# Patient Record
Sex: Male | Born: 1960 | Race: Black or African American | Hispanic: No | Smoking: Current some day smoker
Health system: Southern US, Community
[De-identification: ages and names within clinical notes are randomized; demographics above are authoritative.]

## PROBLEM LIST (undated history)

## (undated) DIAGNOSIS — I10 Essential (primary) hypertension: Secondary | ICD-10-CM

## (undated) DIAGNOSIS — E119 Type 2 diabetes mellitus without complications: Secondary | ICD-10-CM

## (undated) DIAGNOSIS — E785 Hyperlipidemia, unspecified: Secondary | ICD-10-CM

## (undated) DIAGNOSIS — M1990 Unspecified osteoarthritis, unspecified site: Secondary | ICD-10-CM

## (undated) DIAGNOSIS — M109 Gout, unspecified: Secondary | ICD-10-CM

## (undated) DIAGNOSIS — R569 Unspecified convulsions: Secondary | ICD-10-CM

## (undated) HISTORY — DX: Type 2 diabetes mellitus without complications: E11.9

## (undated) HISTORY — DX: Hyperlipidemia, unspecified: E78.5

## (undated) HISTORY — DX: Gout, unspecified: M10.9

## (undated) HISTORY — DX: Unspecified convulsions: R56.9

## (undated) HISTORY — DX: Essential (primary) hypertension: I10

## (undated) HISTORY — DX: Unspecified osteoarthritis, unspecified site: M19.90

---

## 2001-02-04 ENCOUNTER — Emergency Department (HOSPITAL_COMMUNITY): Admission: EM | Admit: 2001-02-04 | Discharge: 2001-02-04 | Payer: Self-pay | Admitting: Emergency Medicine

## 2001-12-08 ENCOUNTER — Encounter: Payer: Self-pay | Admitting: Emergency Medicine

## 2001-12-08 ENCOUNTER — Emergency Department (HOSPITAL_COMMUNITY): Admission: EM | Admit: 2001-12-08 | Discharge: 2001-12-08 | Payer: Self-pay | Admitting: Emergency Medicine

## 2009-05-17 ENCOUNTER — Emergency Department (HOSPITAL_COMMUNITY): Admission: EM | Admit: 2009-05-17 | Discharge: 2009-05-17 | Payer: Self-pay | Admitting: Family Medicine

## 2010-04-05 LAB — POCT I-STAT, CHEM 8
BUN: 12 mg/dL (ref 6–23)
Calcium, Ion: 1.1 mmol/L — ABNORMAL LOW (ref 1.12–1.32)
Chloride: 107 mEq/L (ref 96–112)
Creatinine, Ser: 1 mg/dL (ref 0.4–1.5)
Glucose, Bld: 101 mg/dL — ABNORMAL HIGH (ref 70–99)
HCT: 48 % (ref 39.0–52.0)
Hemoglobin: 16.3 g/dL (ref 13.0–17.0)
Potassium: 4.5 mEq/L (ref 3.5–5.1)
Sodium: 138 mEq/L (ref 135–145)
TCO2: 23 mmol/L (ref 0–100)

## 2010-04-05 LAB — CBC
HCT: 44.2 % (ref 39.0–52.0)
Hemoglobin: 15.6 g/dL (ref 13.0–17.0)
MCHC: 35.3 g/dL (ref 30.0–36.0)
MCV: 98.1 fL (ref 78.0–100.0)
Platelets: 143 10*3/uL — ABNORMAL LOW (ref 150–400)
RBC: 4.5 MIL/uL (ref 4.22–5.81)
RDW: 12.6 % (ref 11.5–15.5)
WBC: 4 10*3/uL (ref 4.0–10.5)

## 2010-04-05 LAB — POCT URINALYSIS DIP (DEVICE)
Bilirubin Urine: NEGATIVE
Glucose, UA: NEGATIVE mg/dL
Ketones, ur: NEGATIVE mg/dL
Nitrite: NEGATIVE
Protein, ur: NEGATIVE mg/dL
Specific Gravity, Urine: 1.02 (ref 1.005–1.030)
Urobilinogen, UA: 0.2 mg/dL (ref 0.0–1.0)
pH: 7 (ref 5.0–8.0)

## 2010-04-05 LAB — DIFFERENTIAL
Basophils Absolute: 0 10*3/uL (ref 0.0–0.1)
Basophils Relative: 0 % (ref 0–1)
Eosinophils Absolute: 0.1 10*3/uL (ref 0.0–0.7)
Eosinophils Relative: 2 % (ref 0–5)
Lymphocytes Relative: 35 % (ref 12–46)
Lymphs Abs: 1.4 10*3/uL (ref 0.7–4.0)
Monocytes Absolute: 0.3 10*3/uL (ref 0.1–1.0)
Monocytes Relative: 8 % (ref 3–12)
Neutro Abs: 2.2 10*3/uL (ref 1.7–7.7)
Neutrophils Relative %: 54 % (ref 43–77)

## 2010-05-27 ENCOUNTER — Emergency Department (HOSPITAL_COMMUNITY)
Admission: EM | Admit: 2010-05-27 | Discharge: 2010-05-27 | Disposition: A | Discharge status: Home / Self Care | Payer: Medicare Other | Attending: Emergency Medicine | Admitting: Emergency Medicine

## 2010-05-27 ENCOUNTER — Emergency Department (HOSPITAL_COMMUNITY): Payer: Medicare Other

## 2010-05-27 DIAGNOSIS — E7800 Pure hypercholesterolemia, unspecified: Secondary | ICD-10-CM | POA: Insufficient documentation

## 2010-05-27 DIAGNOSIS — G40909 Epilepsy, unspecified, not intractable, without status epilepticus: Secondary | ICD-10-CM | POA: Insufficient documentation

## 2010-05-27 DIAGNOSIS — M79609 Pain in unspecified limb: Secondary | ICD-10-CM | POA: Insufficient documentation

## 2010-05-27 DIAGNOSIS — R209 Unspecified disturbances of skin sensation: Secondary | ICD-10-CM | POA: Insufficient documentation

## 2010-05-27 DIAGNOSIS — M7989 Other specified soft tissue disorders: Secondary | ICD-10-CM | POA: Insufficient documentation

## 2010-05-27 DIAGNOSIS — Z79899 Other long term (current) drug therapy: Secondary | ICD-10-CM | POA: Insufficient documentation

## 2011-01-03 ENCOUNTER — Emergency Department: Payer: Self-pay | Admitting: Emergency Medicine

## 2012-06-01 IMAGING — CR DG FOOT COMPLETE 3+V*L*
3 series · 3 of 3 positions shown · non-contrast
Comparison: None.

CLINICAL DATA: Left-sided foot pain.

LEFT FOOT - COMPLETE 3+ VIEW

[t foot ap left]
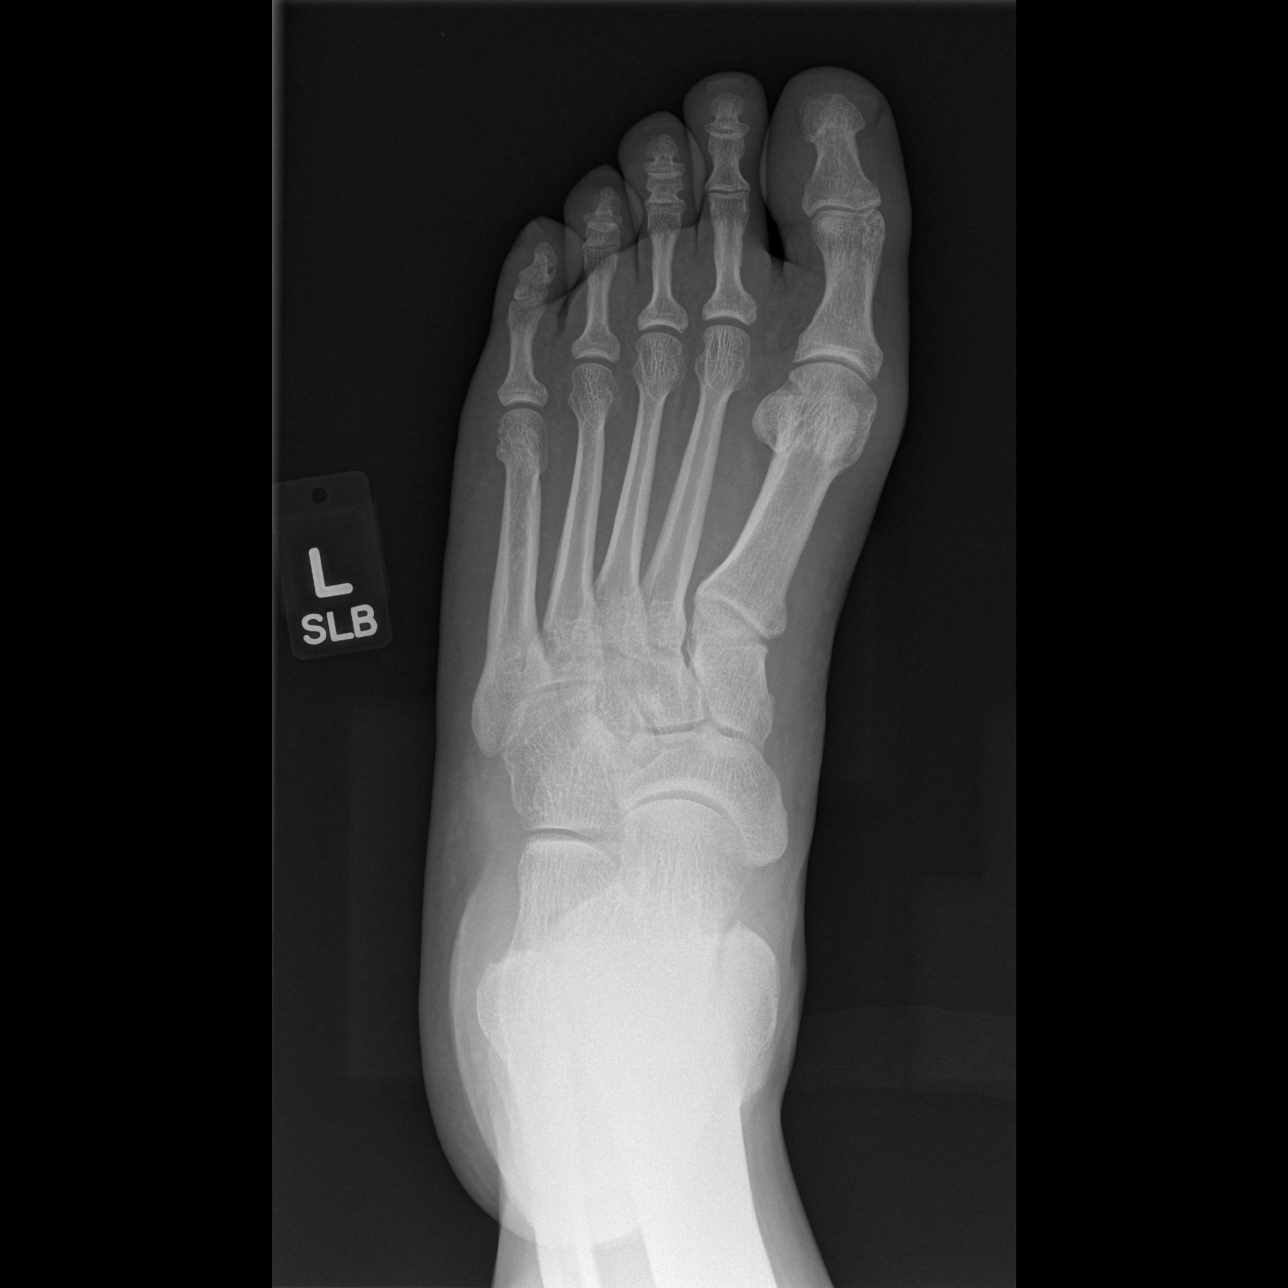

[t foot oblique left]
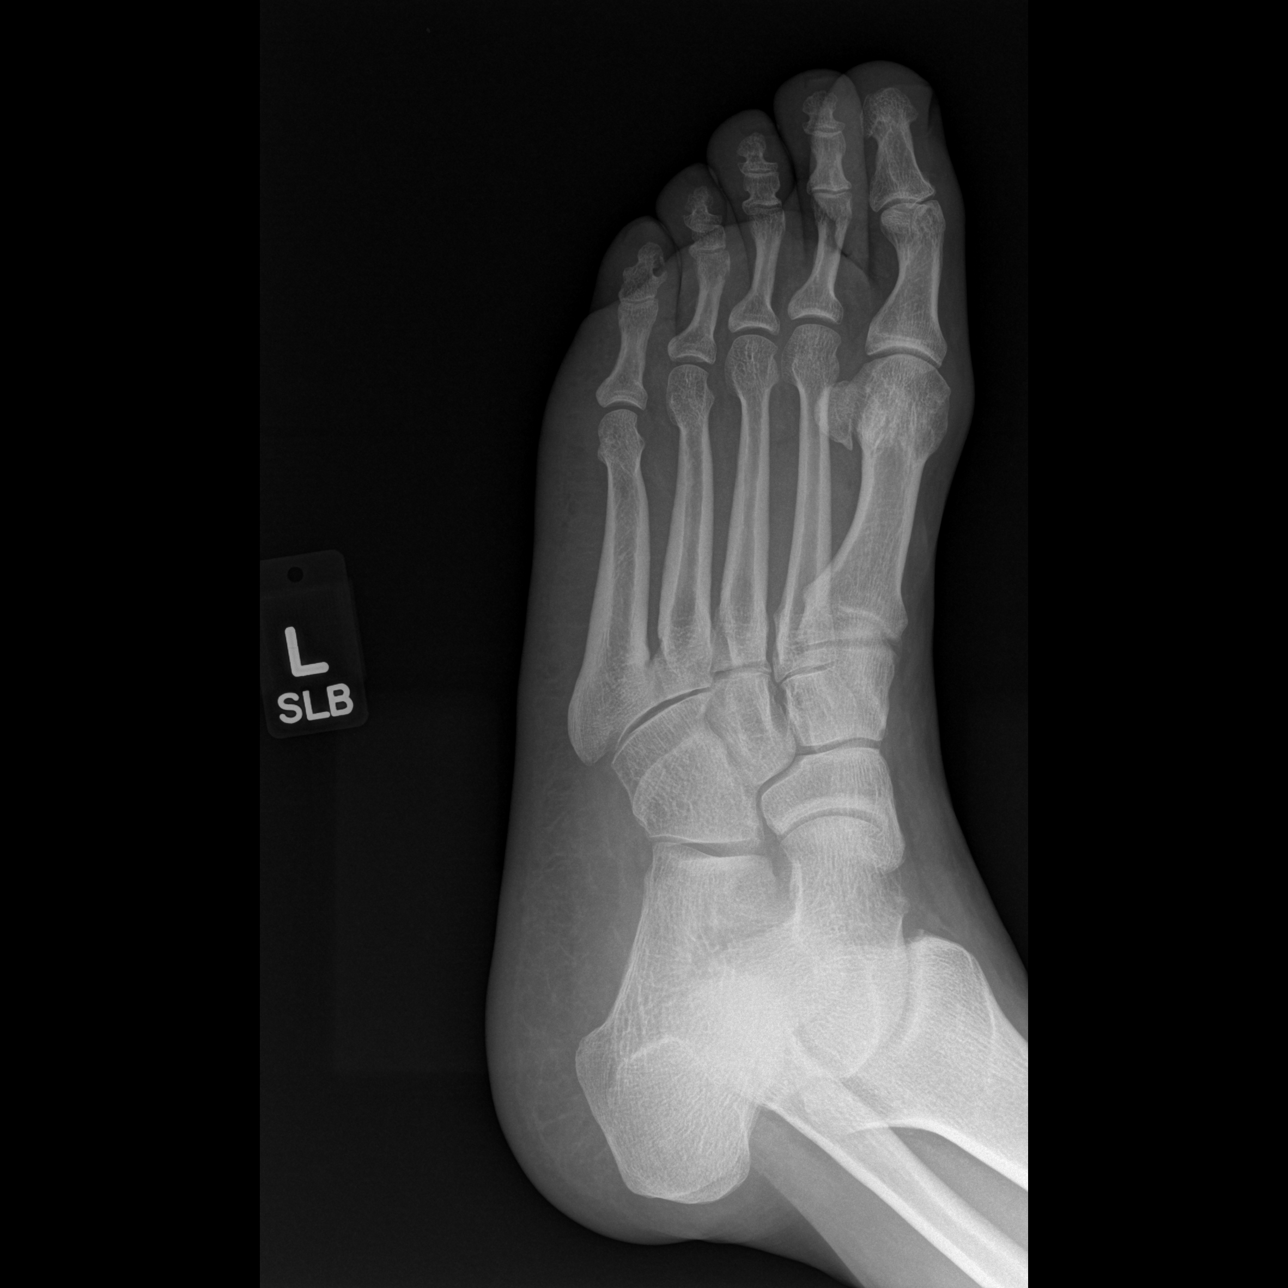

[t foot lat left]
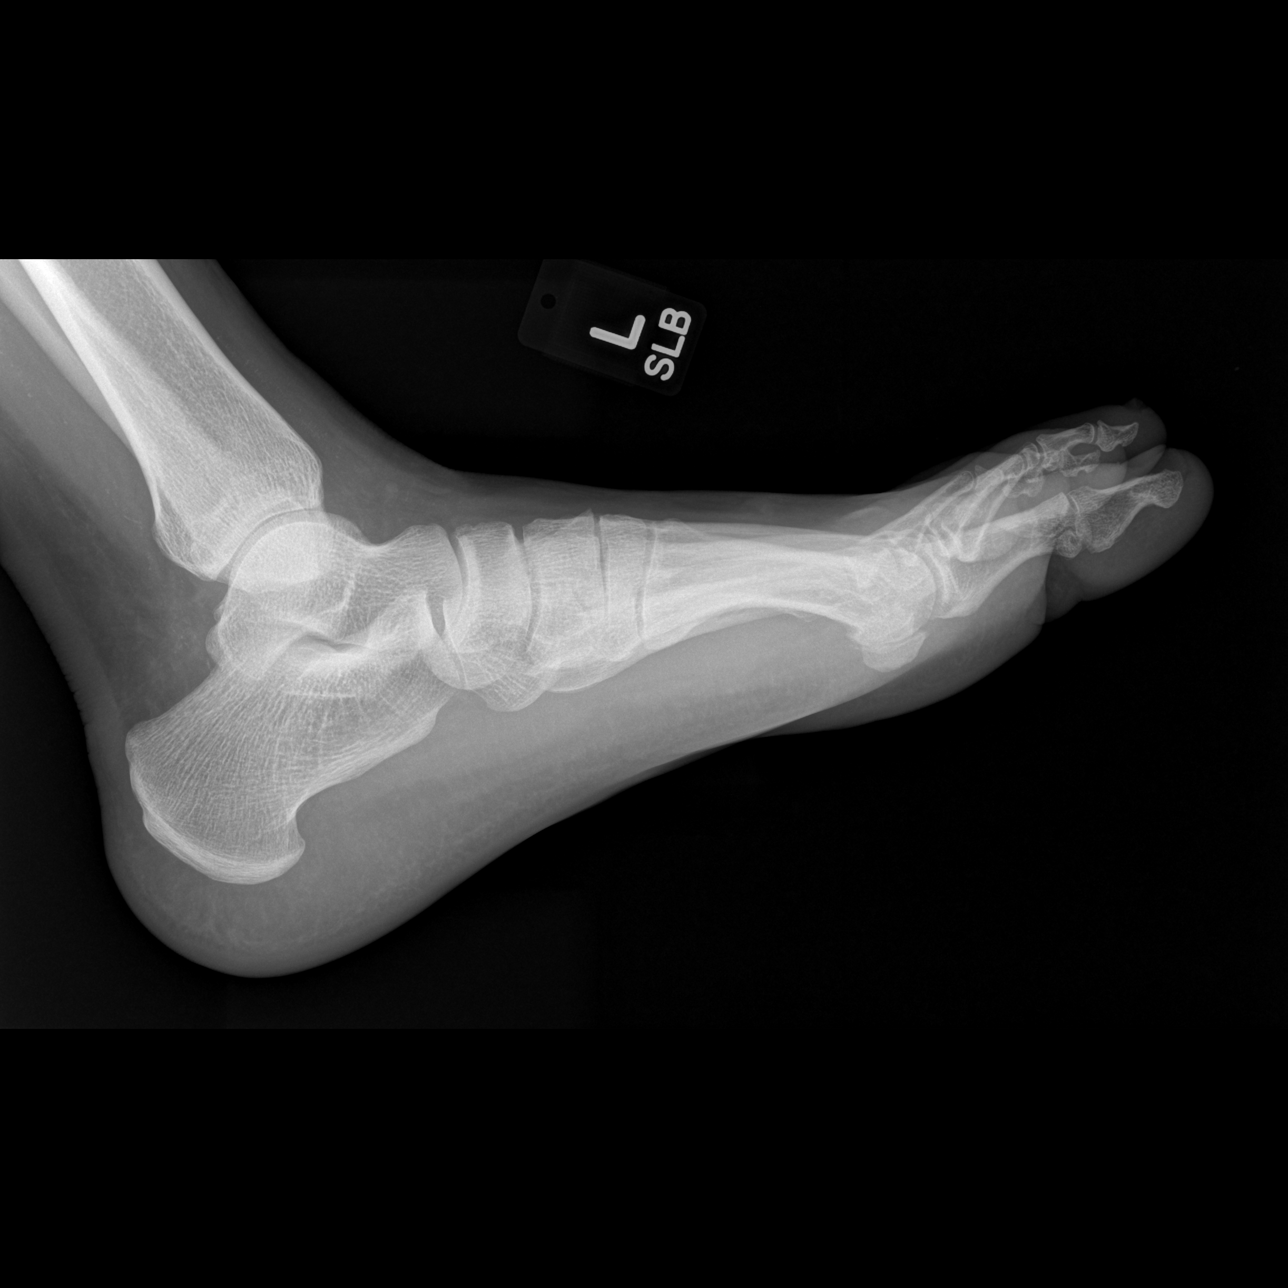

[3 of 3 positions shown; findings below may reference images not displayed]

FINDINGS: No fracture.  No evidence for subluxation or dislocation.
No worrisome lytic or sclerotic osseous abnormality.
IMPRESSION: No acute bony findings.

## 2013-01-08 IMAGING — CR DG CHEST 2V
1 series · 2 of 2 positions shown · non-contrast
Comparison: none

REASON FOR EXAM: chest pain
COMMENTS:

PROCEDURE:     DXR - DXR CHEST PA (OR AP) AND LATERAL  - January 03, 2011  [DATE]
RESULT:
The lung fields are clear. No pneumonia, pneumothorax or pleural effusion is
seen. The heart size is normal. The mediastinal and osseous structures
reveal no significant abnormalities.

[Series 1: w chest pa · 0.14mm/px · 2 of 2 slices shown]
[im 1/2]
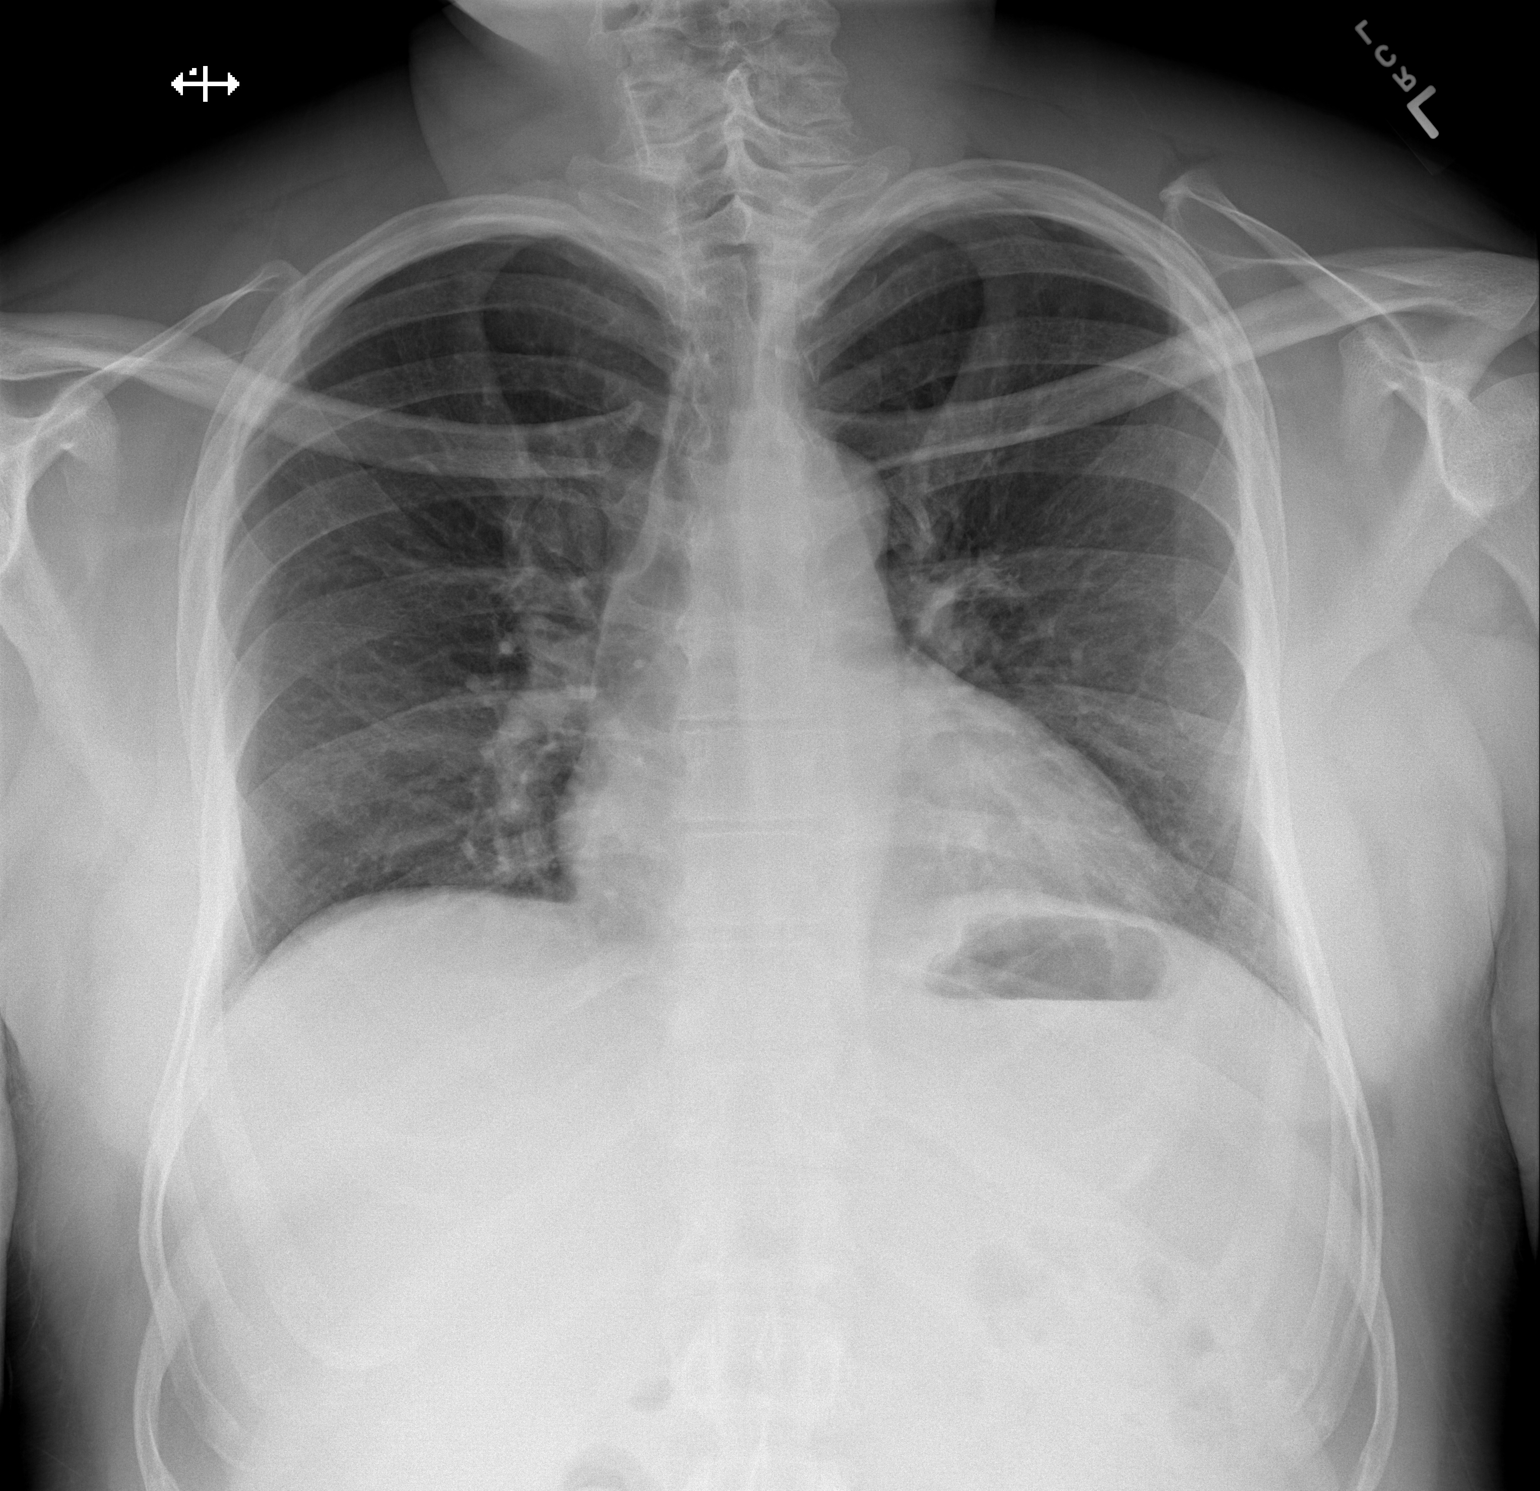
[im 2/2]
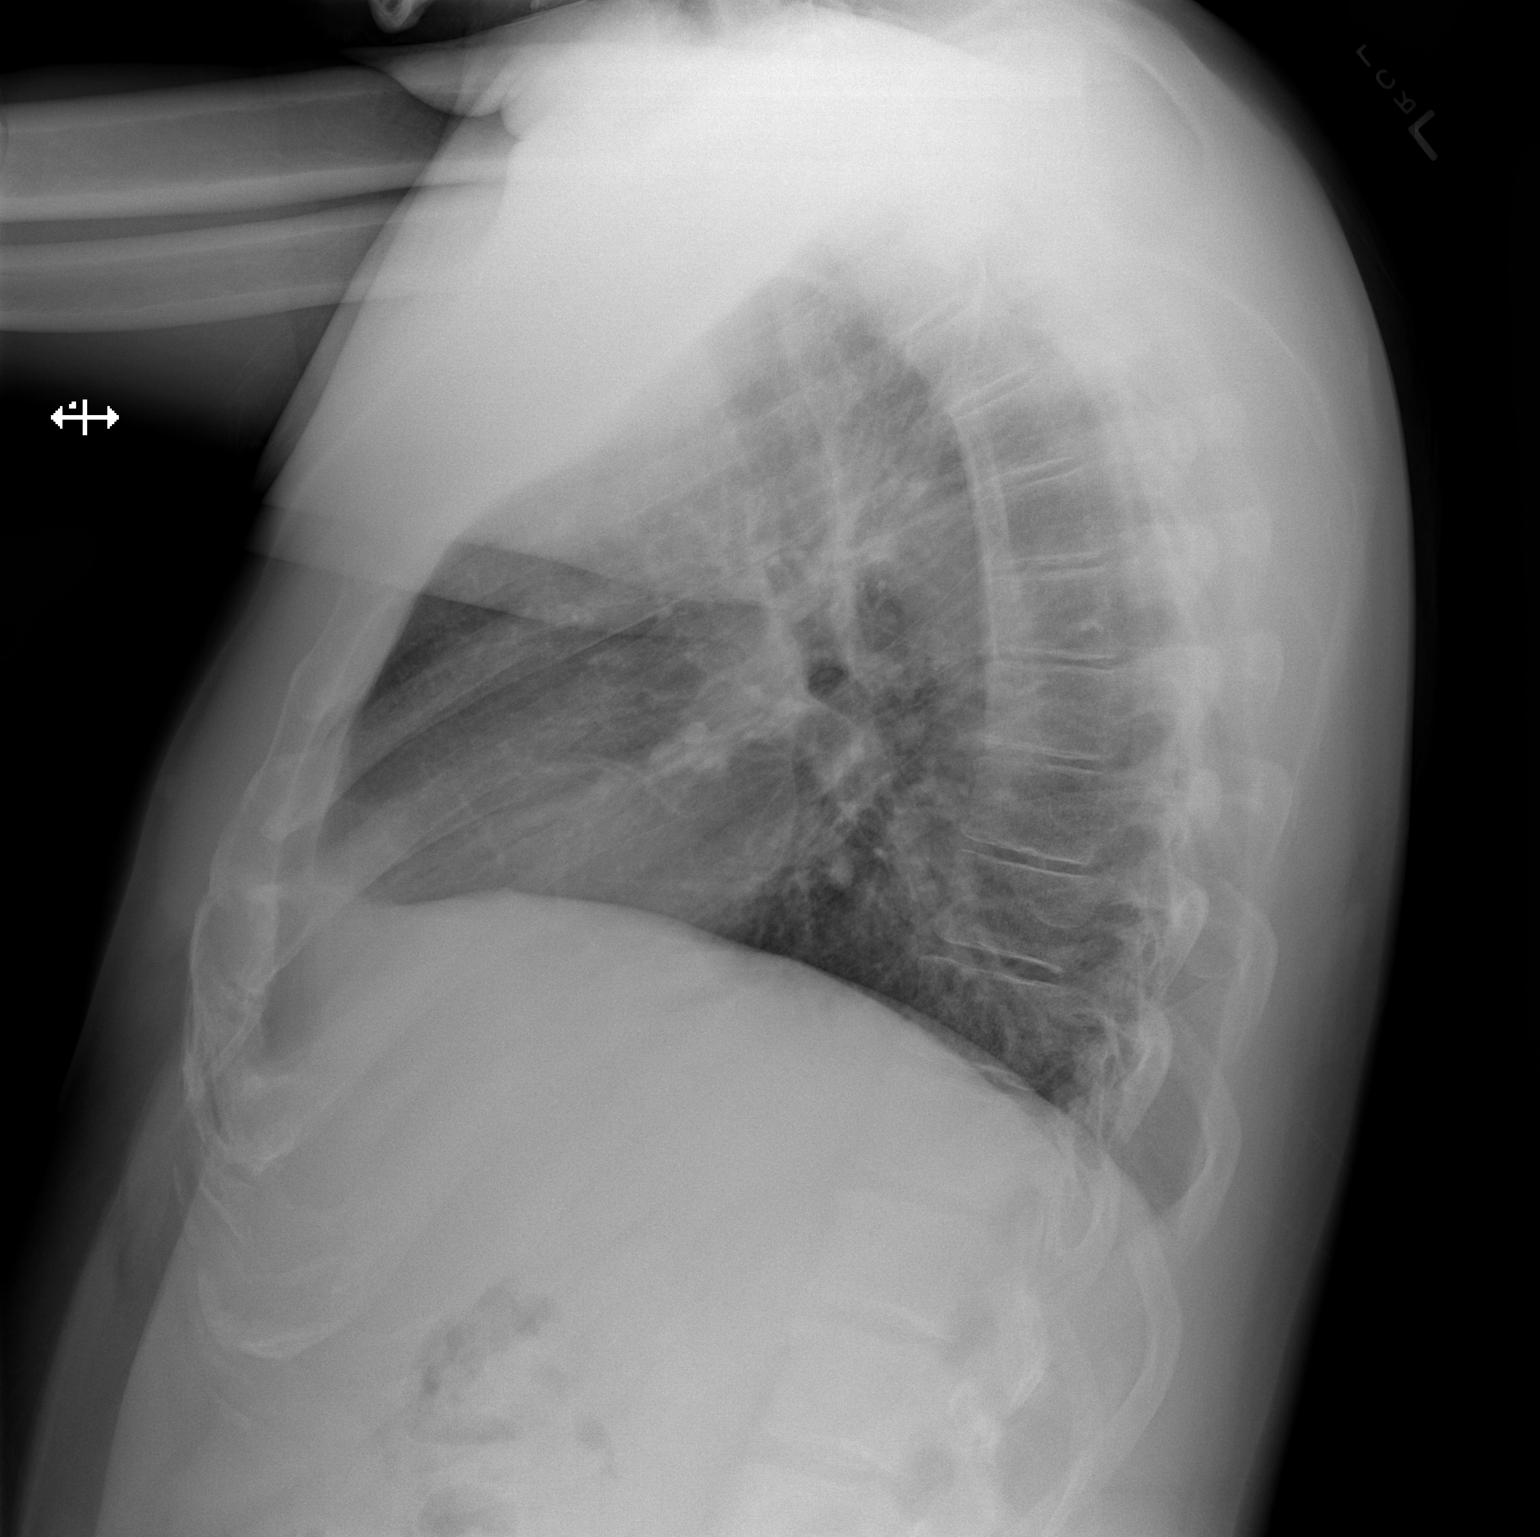

[2 of 2 positions shown; findings below may reference images not displayed]

IMPRESSION: No acute changes are identified.

## 2022-01-10 NOTE — Congregational Nurse Program (Signed)
  Dept: [phone]   Congregational Nurse Program Note  Date of Encounter: 01/10/2022  Clinic visit for blood pressure check and assistance with finding a PCP.  BP 147/89, pulse 72 and regular, O2 Sat 95%, weight 278 Lbs, is to [facility] on 12/[facility], unable to get MD appointment for PCP d/t office closure for the holiday.  He is to return to nurse clinic on Thursday for obtaining a PCP appointment.  Past Medical History: No past medical history on file.  Encounter Details:  CNP Questionnaire - 01/10/22 1200       Questionnaire   Ask client: Do you give verbal consent for me to treat you today? Yes    Student Assistance N/A    Location Patient Served  [facility]    Visit Setting with Client Organization    Patient Status Unhoused    Insurance Medicare    Insurance/Financial Assistance Referral N/A    Medication N/A    Medical Provider No    Screening Referrals Made Annual Wellness Visit    Medical Referrals Made [facility]    Medical Appointment Made N/A    Recently w/o PCP, now 1st time PCP visit completed due to CNs referral or appointment made N/A    Food Have Food Insecurities    Transportation Need transportation assistance    Housing/Utilities No permanent housing    Interpersonal Safety Do not feel safe at current residence    Interventions Advocate/Support;Counsel;Educate;Navigate Healthcare System;Spiritual Care;Reviewed Medications    Abnormal to Normal Screening Since Last CN Visit N/A    Screenings CN Performed Blood Pressure;Weight;ABI    Sent Client to Lab for: N/A    Did client attend any of the following based off CNs referral or appointments made? N/A    ED Visit Averted N/A    Life-Saving Intervention Made N/A

## 2022-01-12 NOTE — Congregational Nurse Program (Signed)
  Dept: [phone]   Congregational Nurse Program Note  Date of Encounter: 01/12/2022  Clinic visit for blood pressure check and to find a PCP.  BP 146/83, pulse 73, O2 Sat 96%.  Complains of episodes of feeling shaky but not a present.  Educated on symptoms of low and high blood glucose levels.  Unable to check blood glucose this visit as he had just finished eating.  Appointment with [facility] for initial PCP visit 03/29/2022.  To see Dr. Delford Field at [facility] 01/18/2022 to order medications to last until sees PCP. Past Medical History: No past medical history on file.  Encounter Details:  CNP Questionnaire - 01/12/22 1140       Questionnaire   Ask client: Do you give verbal consent for me to treat you today? Yes    Student Assistance N/A    Location Patient Served  [facility]    Visit Setting with Client Organization    Patient Status Unhoused    Insurance Medicare    Insurance/Financial Assistance Referral N/A    Medication N/A    Medical Provider No    Screening Referrals Made Annual Wellness Visit    Medical Referrals Made [facility]    Medical Appointment Made [facility]    Recently w/o PCP, now 1st time PCP visit completed due to CNs referral or appointment made N/A    Food Have Food Insecurities    Transportation Need transportation assistance    Housing/Utilities No permanent housing    Interpersonal Safety Do not feel safe at current residence    Interventions Advocate/Support;Counsel;Educate;Navigate Healthcare System;Spiritual Care;Case Management    Abnormal to Normal Screening Since Last CN Visit N/A    Screenings CN Performed Blood Pressure;Pulse Ox    Sent Client to Lab for: N/A    Did client attend any of the following based off CNs referral or appointments made? N/A    ED Visit Averted N/A    Life-Saving Intervention Made N/A

## 2022-01-17 DIAGNOSIS — Z419 Encounter for procedure for purposes other than remedying health state, unspecified: Secondary | ICD-10-CM

## 2022-01-17 LAB — GLUCOSE, POCT (MANUAL RESULT ENTRY): POC Glucose: 122 mg/dl — AB (ref 70–99)

## 2022-01-17 NOTE — Congregational Nurse Program (Signed)
  Dept: [phone]   Congregational Nurse Program Note  Date of Encounter: 01/17/2022  Clinic visit to check blood pressure and blood glucose.  BP remains elevated level 147/93, pulse 70. Blood glucose 122 ac lunch, has taken Metformin daily as prescribed.  Requests assistance with appointment for eye exam,  [employer] is vision insurance, unable to speak with covered provider as of today. Past Medical History: No past medical history on file.  Encounter Details:  CNP Questionnaire - 01/17/22 0925       Questionnaire   Ask client: Do you give verbal consent for me to treat you today? Yes    Student Assistance N/A    Location Patient [facility]    Visit Setting with Client Organization    Patient Status Unhoused    Insurance Medicare    Insurance/Financial Assistance Referral N/A    Medication N/A    Medical Provider No    Screening Referrals [facility]    Medical Referrals Made [facility]    Medical Appointment Made N/A    Recently w/o PCP, now 1st time PCP visit completed due to CNs referral or appointment made N/A    Food Have Food Insecurities    Transportation Need transportation assistance    Housing/Utilities No permanent housing    Interpersonal Safety Do not feel safe at current residence    Interventions Advocate/Support;Counsel;Educate;Navigate Healthcare System;Spiritual Care;Case Management;Reviewed Medications    Abnormal to Normal Screening Since Last CN Visit N/A    Screenings CN Performed Blood Pressure;Pulse Ox;Blood Glucose    Sent Client to Lab for: N/A    Did client attend any of the following based off CNs referral or appointments made? N/A    ED Visit Averted N/A    Life-Saving Intervention Made N/A

## 2022-01-18 ENCOUNTER — Encounter: Payer: Self-pay | Admitting: Physician Assistant

## 2022-01-18 NOTE — Progress Notes (Addendum)
62 yo with HTN, DM, dyslipidema, past hx seizures (no longer on seizure medicines here coming to the shelter.  Has been homeless for 3 months.  Describes episodes which he is able to poorly describe.  Can't describe the episodes well.  Feels strange.  No CP or SOB during episodes.  Started happening after he became homeless.  No numbess, tingling weakness.  C/o lightheadedness.  He's felt depressed since homelessness.  Passive SI, but he wouldn't act on it, his faith would keep him from doing that.  Not interested in medicines.  Wants refills of meds, but doesn't have list.   Today's Vitals   01/18/22 1435  BP: (!) 168/106  Pulse: 72  SpO2: 96%   There is no height or weight on file to calculate BMI.  NAD Unlabored, CTAB RRR, no mgr  Abd soft, NT, ND No LEE Had episode in the room where he had feeling he was poorly able to describe, vitals were taken again and stayed stable.  He was able to talk during the episode, praying to have the feeling go away.  His pulse was regular, no neuro deficits were noted.   A/P Lightheadedness, uneasy sensation: unclear cause.  Going on 3 months. Has been going on since homelessness, panic attacks?  No CP or SOB or any other alarm type symptoms.  Has hx seizures and was previously on seizure meds.  Needs follow up and additional w/u when he follows up with PCP.  I think neurology follow up would be reasonable given his hx seizures.  Pulse was regular during the episode, but wonder if we could get a ziopatch for him?  Given return precautions  Depression/Anxiety: declines meds, related to stress from homelessness.  Recommended f/u with SW.  For his hypertension, diabetes, HLD - needs med refills, doesn't have meds today.  He'll bring to congregational nurse tmrw so we can refill.  Blood pressure is high, needs another med.  Needs appt with eye doc --------------------------------------------------- Pt seen by Dr Florene Glen.  He has been biting his lip.  His  brother has died, he wonders if this is related.   Something seems weird. Came to the shelter on Dec 20th, but was having the sx before then.   Has been having these sx since he has been homeless. Homeless x 3 months. His sister was paying the bills but quit without telling him. He was evicted. He stayed where he could till now.   Sx last 10-15 sec, not often. Last time was about 3 days ago. No unusual circumstances.  Gets a little SOB with the episodes, no DOE.   About 30 yr ago, dx as a seizure pt. He is disabled because of this.   He wonders if he is having seizures now.  Does not remember previous Sz. Unsure if grand-mal, woke up in the hospital, bit his tongue, was told there was shaking.   Had an episode where he had bladder urgency and wet his clothes, new thing for him, but not associated w/ the episodes.    No chest pain.   PCP was in [place] or [place], but needs to change to [place]. Has appt with Dr Redmond Pulling 03/13 at 9 am.   Has meds, but is running low.  Does not have meds with him but can bring them down tomorrow am. Tye [place] can enter them and communicate what refills are needed.   Smokes a little.   Vision is blurred, has eye exam scheduled but has not had  one in a while.   Has pain in the tops his foot, but the foot varies, can be R or L.  This may be 2nd shoes being too tight.    When RN took his BP,  147/89, 147/83,  CBG 122 Today's Vitals   01/18/22 1435  BP: (!) 168/106  Pulse: 72  SpO2: 96%   1+ DP pulses, no LE edema.  Heart RRR, lungs clear  Pt had a spell in the room, no Sz activity or unilateral weakness noted. No difficulty w/ speech or movement. CN intact. BP 155/103, no change in HR or pulses. He felt a little faint, a little SOB. Just feels strange. Sx resolved in < 1 min, no sequelae.   May be a panic attack, but was given info on when to call 911 or tell someone.   Rosaria Ferries, PA-C 01/18/2022 2:54 PM

## 2022-01-19 ENCOUNTER — Emergency Department (HOSPITAL_COMMUNITY)
Admission: EM | Admit: 2022-01-19 | Discharge: 2022-01-19 | Discharge status: Against Medical Advice | Payer: 59 | Attending: Emergency Medicine | Admitting: Emergency Medicine

## 2022-01-19 ENCOUNTER — Encounter (HOSPITAL_COMMUNITY): Payer: Self-pay | Admitting: *Deleted

## 2022-01-19 ENCOUNTER — Other Ambulatory Visit: Payer: Self-pay

## 2022-01-19 ENCOUNTER — Ambulatory Visit (HOSPITAL_COMMUNITY)
Admission: EM | Admit: 2022-01-19 | Discharge: 2022-01-19 | Disposition: A | Discharge status: Home / Self Care | Payer: 59

## 2022-01-19 DIAGNOSIS — Z5321 Procedure and treatment not carried out due to patient leaving prior to being seen by health care provider: Secondary | ICD-10-CM | POA: Insufficient documentation

## 2022-01-19 DIAGNOSIS — W19XXXA Unspecified fall, initial encounter: Secondary | ICD-10-CM | POA: Diagnosis not present

## 2022-01-19 DIAGNOSIS — R55 Syncope and collapse: Secondary | ICD-10-CM | POA: Insufficient documentation

## 2022-01-19 LAB — CBC
HCT: 47.1 % (ref 39.0–52.0)
Hemoglobin: 15.8 g/dL (ref 13.0–17.0)
MCH: 31.3 pg (ref 26.0–34.0)
MCHC: 33.5 g/dL (ref 30.0–36.0)
MCV: 93.5 fL (ref 80.0–100.0)
Platelets: 196 10*3/uL (ref 150–400)
RBC: 5.04 MIL/uL (ref 4.22–5.81)
RDW: 12.7 % (ref 11.5–15.5)
WBC: 6.5 10*3/uL (ref 4.0–10.5)
nRBC: 0 % (ref 0.0–0.2)

## 2022-01-19 LAB — URINALYSIS, ROUTINE W REFLEX MICROSCOPIC
Bacteria, UA: NONE SEEN
Bilirubin Urine: NEGATIVE
Glucose, UA: NEGATIVE mg/dL
Ketones, ur: NEGATIVE mg/dL
Leukocytes,Ua: NEGATIVE
Nitrite: NEGATIVE
Protein, ur: 30 mg/dL — AB
Specific Gravity, Urine: 1.017 (ref 1.005–1.030)
pH: 6 (ref 5.0–8.0)

## 2022-01-19 LAB — COMPREHENSIVE METABOLIC PANEL
ALT: 21 U/L (ref 0–44)
AST: 21 U/L (ref 15–41)
Albumin: 4.4 g/dL (ref 3.5–5.0)
Alkaline Phosphatase: 53 U/L (ref 38–126)
Anion gap: 11 (ref 5–15)
BUN: 9 mg/dL (ref 8–23)
CO2: 24 mmol/L (ref 22–32)
Calcium: 9.1 mg/dL (ref 8.9–10.3)
Chloride: 95 mmol/L — ABNORMAL LOW (ref 98–111)
Creatinine, Ser: 0.94 mg/dL (ref 0.61–1.24)
GFR, Estimated: >60 mL/min (ref >60)
Glucose, Bld: 130 mg/dL — ABNORMAL HIGH (ref 70–99)
Potassium: 3.9 mmol/L (ref 3.5–5.1)
Sodium: 130 mmol/L — ABNORMAL LOW (ref 135–145)
Total Bilirubin: 0.5 mg/dL (ref 0.3–1.2)
Total Protein: 8 g/dL (ref 6.5–8.1)

## 2022-01-19 LAB — CBG MONITORING, ED: Glucose-Capillary: 132 mg/dL — ABNORMAL HIGH (ref 70–99)

## 2022-01-19 NOTE — ED Notes (Signed)
Patient called for vitals x3 

## 2022-01-19 NOTE — ED Notes (Signed)
Patient called for vitals x2 

## 2022-01-19 NOTE — Discharge Instructions (Addendum)
As discussed, please go to the nearest emergency department if you have another loss of consciousness episode.  Follow up with PCP and establish care with a neurologist due to seizure history.   Please go to the emergency department if any of these symptoms occur:  Inability to awaken the patient at time of expected wakening Severe or worsening headaches Somnolence or confusion Restlessness, unsteadiness, or seizures Difficulties with vision Vomiting, fever, or stiff neck Urinary or bowel incontinence Weakness or numbness involving any part of the body

## 2022-01-19 NOTE — ED Notes (Signed)
Patient called for vitals x1 

## 2022-01-19 NOTE — ED Triage Notes (Signed)
Pt states walking around outside this morning when he had pass out. Pt states he work up on the floor outside a few hours later. He then waited on the bus to come to the ED> pt denies any pain. Pt alert and answer all questions appropriately in triage. Denies CP/SOB. T 98.3 oral, warm to touch. Pt was ambulatory with four wheel walker.

## 2022-01-19 NOTE — ED Notes (Signed)
Patient called for vital sign reassessment with no answer. Dragging OTF.

## 2022-01-19 NOTE — ED Provider Notes (Addendum)
[facility]    CSN: [id number] Arrival date & time: 01/19/22  1126      History   Chief Complaint Chief Complaint  Patient presents with   Fall    HPI Jared Gibson is a 62 y.o. male.  Patient presents due to a brief loss of consciousness  and fall that occurred this morning.  He reports that upon onset he woke up and noticed he was laying on the ground.  He does not believe that he has hit his head during this fall.  He went to the emergency department today but left due to the wait time.  He reports that he is feeling a lot better currently.  He reports a history of seizures that has not occurred in the past 25 years.  He denies any postictal symptoms, he is uncertain if he had a seizure. He currently is not taking any seizure medications.   He reports that he is at a homeless shelter currently.  He reports that he needs a note stating that he can return to homeless shelter.  He is concerned for his wellbeing because he has nowhere else to go if he cannot return to the homeless shelter.    Fall Pertinent negatives include no chest pain, no headaches and no shortness of breath.    Past Medical History:  Diagnosis Date   DM2 (diabetes mellitus, type 2) ([place])    HLD (hyperlipidemia)    HTN (hypertension)    Seizure ([place])     There are no problems to display for this patient.   History reviewed. No pertinent surgical history.     Home Medications    Prior to Admission medications   Medication Sig Start Date End Date Taking? Authorizing Provider  aspirin EC 81 MG tablet Take 81 mg by mouth daily. Swallow whole.   Yes [provider]  METFORMIN HCL PO Take by mouth.   Yes [provider]  UNKNOWN TO PATIENT He reports " he's taking a cholesterol medication" but unaware as to which medicine   Yes [provider]    Family History Family History  Problem Relation Age of Onset   Hypertension Mother    Diabetes Mother      Social History Social History   Tobacco Use   Smoking status: Some Days    Types: Cigarettes   Smokeless tobacco: Never  Vaping Use   Vaping Use: Never used  Substance Use Topics   Alcohol use: Not Currently   Drug use: Never     Allergies   Patient has no known allergies.   Review of Systems Review of Systems  Constitutional:  Negative for activity change, appetite change, chills, fatigue and fever.  Eyes: Negative.  Negative for photophobia and visual disturbance.  Respiratory:  Negative for shortness of breath.   Cardiovascular:  Negative for chest pain and palpitations.  Gastrointestinal: Negative.   Endocrine: Negative.   Neurological:  Positive for syncope. Negative for dizziness, tremors, seizures, facial asymmetry, speech difficulty, weakness, light-headedness, numbness and headaches.     Physical Exam Triage Vital Signs ED Triage Vitals  Enc Vitals Group     BP 01/19/22 1424 (!) 168/103     Pulse Rate 01/19/22 1424 66     Resp 01/19/22 1424 18     Temp 01/19/22 1424 98.1 F (36.7 C)     Temp Source 01/19/22 1424 Oral     SpO2 01/19/22 1424 95 %     Weight --  Height --      Head Circumference --      Peak Flow --      Pain Score 01/19/22 1420 0     Pain Loc --      Pain Edu? --      Excl. in [place]? --    No data found.  Updated Vital Signs BP (!) 168/103 (BP Location: Right Arm)   Pulse 66   Temp 98.1 F (36.7 C) (Oral)   Resp 18   SpO2 95%     Physical Exam Vitals and nursing note reviewed.  Constitutional:      Appearance: Normal appearance.  Eyes:     General: Lids are normal. No visual field deficit.    Extraocular Movements: Extraocular movements intact.     Right eye: Normal extraocular motion and no nystagmus.     Left eye: Normal extraocular motion and no nystagmus.  Cardiovascular:     Rate and Rhythm: Normal rate and regular rhythm.     Heart sounds: Normal heart sounds and S1 normal.  Pulmonary:     Effort: Pulmonary  effort is normal.     Breath sounds: Normal breath sounds and air entry. No decreased breath sounds, wheezing, rhonchi or rales.  Neurological:     General: No focal deficit present.     Mental Status: He is alert and oriented to person, place, and time.     GCS: GCS eye subscore is 4. GCS verbal subscore is 5. GCS motor subscore is 6.     Cranial Nerves: Cranial nerves 2-12 are intact. No cranial nerve deficit, dysarthria or facial asymmetry.     Sensory: Sensation is intact. No sensory deficit.     Motor: Motor function is intact. No weakness, tremor, atrophy or seizure activity.     Coordination: Romberg sign negative. Coordination normal. Heel to Shin Test normal.     Gait: Gait is intact. Gait normal.      UC Treatments / Results  Labs (all labs ordered are listed, but only abnormal results are displayed) Labs Reviewed - No data to display  EKG   Radiology No results found.  Procedures Procedures (including critical care time)  Medications Ordered in UC Medications - No data to display  Initial Impression / Assessment and Plan / UC Course  I have reviewed the triage vital signs and the nursing notes.  Pertinent labs & imaging results that were available during my care of the patient were reviewed by me and considered in my medical decision making (see chart for details).     Patient presents due to fall and brief loss of consciousness.  Uncertain etiology of symptoms. This Probation officer reviewed CBC, CMP, CBG, and urinalysis from emergency department.  Neurological assessment was reassuring in office.  Patient was made aware of protocol to follow-up at emergency department due to a loss of consciousness.  Patient stated that he does not want to return to the emergency room, he stated that he feels " completely fine" and he needs to be able to enter back into the shelter to have somewhere to stay tonight.  Based on physical assessment, this writer has low concern for a  neurological emergency.  Shared decision-making was used to provide the patient with a note for the shelter and this writer reinforced to the patient that if any loss of consciousness occurs again or neurological symptoms, he will need to go straight to the emergency department.  Patient was made aware to follow-up with  the [facility] (he states is working with him on PCP) to have a referral sent out to neurology for follow-up.  Patient verbalized understanding of instructions.   Charting was provided using a a verbal dictation system, charting was proofread for errors, errors may occur which could change the meaning of the information charted.   Final Clinical Impressions(s) / UC Diagnoses   Final diagnoses:  Fall, initial encounter  Brief loss of consciousness     Discharge Instructions      As discussed, please go to the nearest emergency department if you have another loss of consciousness episode.  Follow up with PCP and establish care with a neurologist due to seizure history.   Please go to the emergency department if any of these symptoms occur:  Inability to awaken the patient at time of expected wakening Severe or worsening headaches Somnolence or confusion Restlessness, unsteadiness, or seizures Difficulties with vision Vomiting, fever, or stiff neck Urinary or bowel incontinence Weakness or numbness involving any part of the body    ED Prescriptions   None    PDMP not reviewed this encounter.   Debby Freiberg, NP 01/19/22 1510    Debby Freiberg, NP 01/19/22 1511

## 2022-01-19 NOTE — ED Triage Notes (Signed)
Pt states that he was seen in ED but left due to wait time. He is living in the shelter and needs a note from a doctor to be able to go back to the shelter.   He states he hasn't taken his meds yet. He states that he woke up with a headache but doesn't think he hit his head. He does have a hx of seizures and claims he did have a spell again when he was in the ER.   He doesn't know what meds he is on and he doesn't have access to them since his meds are in the shelter.    Pt states walking around outside this morning when he had pass out. Pt states he work up on the floor outside a few hours later. He then waited on the bus to come to the ED> pt denies any pain. Pt alert and answer all questions appropriately in triage. Denies CP/SOB. T 98.3 oral, warm to touch. Pt was ambulatory with four wheel walker.

## 2022-01-24 DIAGNOSIS — Z419 Encounter for procedure for purposes other than remedying health state, unspecified: Secondary | ICD-10-CM

## 2022-01-24 LAB — GLUCOSE, POCT (MANUAL RESULT ENTRY): POC Glucose: 116 mg/dl — AB (ref 70–99)

## 2022-01-24 NOTE — Congregational Nurse Program (Signed)
  Dept: [phone]   Congregational Nurse Program Note  Date of Encounter: 01/24/2022  Clinic visit to check blood glucose pc breakfast, 116 result.  BP remains elevated 144/87, pulse 78, O2 Sat 96%.  States that he [place] where he had a "spasm" and didn't know where he was and an ambulance was called.  When to ER but was better and didn't stay after waiting several hours to be seen.  Is out of Metformin and his blood pressure medication, advised to see [facility].  He requested assistance with a vision appointment to be scheduled with a provider that is in-network with his insurance. Past Medical History: Past Medical History:  Diagnosis Date   DM2 (diabetes mellitus, type 2) ([place])    HLD (hyperlipidemia)    HTN (hypertension)    Seizure ([place])     Encounter Details:  CNP Questionnaire - 01/24/22 0940       Questionnaire   Ask client: Do you give verbal consent for me to treat you today? Yes    Student Assistance N/A    Location Patient [facility]    Visit Setting with Client Organization    Patient Status Unhoused    Insurance Medicare    Insurance/Financial Assistance Referral N/A    Medication N/A    Medical Provider No    Screening Referrals Made N/A    Medical Referrals Made [facility] PCP/Clinic    Medical Appointment Made N/A    Recently w/o PCP, now 1st time PCP visit completed due to CNs referral or appointment made N/A    Food Have Food Insecurities    Transportation Need transportation assistance    Housing/Utilities No permanent housing    Interpersonal Safety Do not feel safe at current residence    Interventions Advocate/Support;Counsel;Educate;Navigate Healthcare System;Spiritual Care;Case Management;Reviewed Medications    Abnormal to Normal Screening Since Last CN Visit N/A    Screenings CN Performed Blood Pressure;Pulse Ox;Blood Glucose    Sent Client to Lab for: N/A    Did client attend any of the  following based off CNs referral or appointments made? N/A    ED Visit Averted N/A    Life-Saving Intervention Made N/A

## 2022-02-01 ENCOUNTER — Other Ambulatory Visit (HOSPITAL_COMMUNITY): Payer: Self-pay

## 2022-02-01 ENCOUNTER — Encounter: Payer: Self-pay | Admitting: Physician Assistant

## 2022-02-01 NOTE — Progress Notes (Signed)
Meds updated and refilled, lisinopril HCTZ increased.  Today's Vitals   02/02/22 1419  BP: (!) 150/97  Pulse: 68  SpO2: 98%   Labs from 01/04 ER visit reviewed below, renal function normal.  He had some hematuria and proteinuria, follow-up with primary care. CBC    Component Value Date/Time   WBC 6.5 01/19/2022 0646   RBC 5.04 01/19/2022 0646   HGB 15.8 01/19/2022 0646   HCT 47.1 01/19/2022 0646   PLT 196 01/19/2022 0646   MCV 93.5 01/19/2022 0646   MCH 31.3 01/19/2022 0646   MCHC 33.5 01/19/2022 0646   RDW 12.7 01/19/2022 0646   LYMPHSABS 1.4 05/17/2009 1055   MONOABS 0.3 05/17/2009 1055   EOSABS 0.1 05/17/2009 1055   BASOSABS 0.0 05/17/2009 1055      Latest Ref Rng & Units 01/19/2022    6:46 AM 05/17/2009   10:52 AM  CMP  Glucose 70 - 99 mg/dL 130  101   BUN 8 - 23 mg/dL 9  12   Creatinine 0.61 - 1.24 mg/dL 0.94  1.0   Sodium 135 - 145 mmol/L 130  138   Potassium 3.5 - 5.1 mmol/L 3.9  4.5   Chloride 98 - 111 mmol/L 95  107   CO2 22 - 32 mmol/L 24    Calcium 8.9 - 10.3 mg/dL 9.1    Total Protein 6.5 - 8.1 g/dL 8.0    Total Bilirubin 0.3 - 1.2 mg/dL 0.5    Alkaline Phos 38 - 126 U/L 53    AST 15 - 41 U/L 21    ALT 0 - 44 U/L 21      Callyn Severtson, PA-C 02/02/2022 2:20 PM

## 2022-02-08 ENCOUNTER — Other Ambulatory Visit: Payer: Self-pay | Admitting: Physician Assistant

## 2022-02-08 MED ORDER — LISINOPRIL-HYDROCHLOROTHIAZIDE 20-25 MG PO TABS: 1.0000 | ORAL_TABLET | Freq: Every day | ORAL | 1 refills | Status: DC

## 2022-02-08 NOTE — Progress Notes (Signed)
Sent to the wrong [facility], error corrected

## 2022-02-09 NOTE — Congregational Nurse Program (Signed)
  Dept: [phone]   Congregational Nurse Program Note  Date of Encounter: 02/09/2022  Nurse sent note to Provider Rosaria Ferries PA regarding BP medicine.  Resident has enough BP med to last through 02-13-22.  He is to see provider on 02-14-22.  Past Medical History: Past Medical History:  Diagnosis Date   DM2 (diabetes mellitus, type 2) ([place])    HLD (hyperlipidemia)    HTN (hypertension)    Seizure ([place])     Encounter Details:  CNP Questionnaire - 02/09/22 1742       Questionnaire   Ask client: Do you give verbal consent for me to treat you today? Yes    Student Assistance N/A    Location Patient [facility]    Visit Setting with Client Organization    Patient Status Unhoused    Insurance Medicare    Insurance/Financial Assistance Referral N/A    Medication N/A    Medical Provider No    Screening Referrals Made N/A    Medical Referrals Made Non-[facility] PCP/Clinic    Medical Appointment Made N/A    Recently w/o PCP, now 1st time PCP visit completed due to CNs referral or appointment made N/A    Food Have Food Insecurities    Transportation Need transportation assistance    Housing/Utilities No permanent housing    Interpersonal Safety Do not feel safe at current residence    Interventions Advocate/Support;Counsel;Educate;Navigate Healthcare System;Reviewed Medications    Abnormal to Normal Screening Since Last CN Visit N/A    Screenings CN Performed N/A    Sent Client to Lab for: N/A    Did client attend any of the following based off CNs referral or appointments made? N/A    ED Visit Averted N/A    Life-Saving Intervention Made N/A

## 2022-02-16 ENCOUNTER — Other Ambulatory Visit: Payer: Self-pay

## 2022-02-16 NOTE — Congregational Nurse Program (Signed)
  Dept: [phone]   Congregational Nurse Program Note  Date of Encounter: 02/16/2022  Clinic visit to check blood pressure, ran out of BP med on 02/14/2022.  BP 138/85, pulse 76 and regular, O2 sat 98%.  Has painful area right foot below great toe. Past Medical History: Past Medical History:  Diagnosis Date   DM2 (diabetes mellitus, type 2) ([place])    HLD (hyperlipidemia)    HTN (hypertension)    Seizure ([place])     Encounter Details:  CNP Questionnaire - 02/16/22 1558       Questionnaire   Ask client: Do you give verbal consent for me to treat you today? Yes    Student Assistance [employer] Nurse    Location Patient [facility]    Visit Setting with Client Organization    Patient Status Unhoused    Insurance Medicare    Insurance/Financial Assistance Referral N/A    Medication N/A    Medical Provider No    Screening Referrals Made N/A    Medical Referrals Made Non[facility] PCP/Clinic    Medical Appointment Made N/A    Recently w/o PCP, now 1st time PCP visit completed due to CNs referral or appointment made N/A    Food Have Food Insecurities    Transportation Need transportation assistance    Housing/Utilities No permanent housing    Interpersonal Safety Do not feel safe at current residence    Interventions Advocate/Support;Counsel;Educate;Navigate Healthcare System;Reviewed Medications    Abnormal to Normal Screening Since Last CN Visit N/A    Screenings CN Performed Blood Pressure;Pulse Ox    Sent Client to Lab for: N/A    Did client attend any of the following based off CNs referral or appointments made? N/A    ED Visit Averted N/A    Life-Saving Intervention Made N/A

## 2022-03-01 ENCOUNTER — Other Ambulatory Visit: Payer: Self-pay | Admitting: Critical Care Medicine

## 2022-03-01 ENCOUNTER — Encounter: Payer: Self-pay | Admitting: Physician Assistant

## 2022-03-01 MED ORDER — CEFDINIR 300 MG PO CAPS: 300.0000 mg | ORAL_CAPSULE | Freq: Two times a day (BID) | ORAL | 0 refills | Status: AC

## 2022-03-01 MED ORDER — ATORVASTATIN CALCIUM 20 MG PO TABS: 20.0000 mg | ORAL_TABLET | Freq: Every day | ORAL | 3 refills | Status: DC

## 2022-03-01 NOTE — Progress Notes (Cosign Needed)
Pt seen by Dr Joya Gaskins.   Has appt 03/13 at 9:00 am w/ Dr Rich Fuchs. Info given to [place].  Has all meds except cholesterol pill >> will order atorva 20 mg.   Was given amlodipine 10 mg qd 10/2021 was last fill.   Both legs started swelling a few days ago. He admits to extra salt use.   Has nasal congestion and cough, sore throat. Had yellow-greenish sputum but he has been hydrating and the sputum is clearing up.   On exam, purulent sputum seen in both nares and is draining down his throat, will get Cefdinir sent in, pt given saline nasal rinse and mucinex with instructions.   Today's Vitals   03/01/22 1611  BP: (!) 166/94  Pulse: 76  SpO2: 97%  Weight: 284 lb 3.2 oz (128.9 kg)  Height: 6' 2"$  (1.88 m)   Body mass index is 36.49 kg/m.  Pt encouraged to come next week for a BP/weight check.  Rosaria Ferries, PA-C 03/01/2022 4:21 PM  Patient seen and examined Blood pressure elevated and has upcoming primary care appointment encouraged to keep this appointment  No changes made Asencion Noble

## 2022-03-08 ENCOUNTER — Encounter: Payer: Self-pay | Admitting: Physician Assistant

## 2022-03-08 ENCOUNTER — Other Ambulatory Visit: Payer: Self-pay | Admitting: Critical Care Medicine

## 2022-03-08 MED ORDER — METFORMIN HCL 500 MG PO TABS: 500.0000 mg | ORAL_TABLET | Freq: Two times a day (BID) | ORAL | 0 refills | Status: DC

## 2022-03-08 NOTE — Progress Notes (Signed)
Pt still w/ LE edema, but legs are a little smaller. Legs go down overnight, but swelling comes on during the day. Wt down 1 lb. Elastic cut on socks and diabetic socks given.   ABX almost complete, has 2 pills left, sx are improved. Lungs are now clear.   CBG 93 - needs refill on metformin, does not have enough to last till MD visit.   Today's Vitals   03/08/22 1539  BP: (!) 158/92  Pulse: 76  SpO2: 96%  Weight: 284 lb (128.8 kg)   Body mass index is 36.46 kg/m.  BP is elevated but has been close to normal in the past. No meds changes for now, follow.   Rosaria Ferries, PA-C 03/08/2022 3:50 PM

## 2022-03-08 NOTE — Progress Notes (Signed)
Meds refilled.

## 2022-03-15 NOTE — Congregational Nurse Program (Signed)
  Dept: [phone]   Congregational Nurse Program Note  Date of Encounter: 03/14/2022  Clinic visit to check blood pressure, BP 126/80, pulse 68 and regular, O2 Sat 95%.  States he is taking all of his medications as prescribed and has enough medicine to last until initial visit with new PCP on March 13. Assited with transportation arrangements for the MD appointment Past Medical History: Past Medical History:  Diagnosis Date   DM2 (diabetes mellitus, type 2) ([place])    HLD (hyperlipidemia)    HTN (hypertension)    Seizure ([place])     Encounter Details:  CNP Questionnaire - 03/14/22 0941       Questionnaire   Ask client: Do you give verbal consent for me to treat you today? Yes    Student Assistance N/A    Location Patient [facility]    Visit Setting with Client Organization    Patient Status Unhoused    Insurance Medicare    Insurance/Financial Assistance Referral N/A    Medication N/A    Medical Provider No    Screening Referrals Made N/A    Medical Referrals Made N/A    Medical Appointment Made N/A    Recently w/o PCP, now 1st time PCP visit completed due to CNs referral or appointment made N/A    Food Have Food Insecurities    Transportation Need transportation assistance;Provided transportation assistance    Housing/Utilities No permanent housing    Interpersonal Safety Do not feel safe at current residence    Interventions Advocate/Support;Counsel;Educate;Navigate Healthcare System;Reviewed Medications    Abnormal to Normal Screening Since Last CN Visit N/A    Screenings CN Performed Blood Pressure;Pulse Ox    Sent Client to Lab for: N/A    Did client attend any of the following based off CNs referral or appointments made? N/A    ED Visit Averted N/A    Life-Saving Intervention Made N/A

## 2022-03-23 DIAGNOSIS — Z419 Encounter for procedure for purposes other than remedying health state, unspecified: Secondary | ICD-10-CM

## 2022-03-23 LAB — GLUCOSE, POCT (MANUAL RESULT ENTRY): POC Glucose: 97 mg/dl (ref 70–99)

## 2022-03-23 NOTE — Congregational Nurse Program (Signed)
  Dept: [phone]   Congregational Nurse Program Note  Date of Encounter: 03/23/2022  Client vital signs within normal limits and taking medications as prescribed. Client verbalized understanding. Transportation arranged for 03/28/22 MD appointment, for new PCP.  Past Medical History: Past Medical History:  Diagnosis Date   DM2 (diabetes mellitus, type 2) ([place])    HLD (hyperlipidemia)    HTN (hypertension)    Seizure ([place])     Encounter Details:  CNP Questionnaire - 03/23/22 1030       Questionnaire   Ask client: Do you give verbal consent for me to treat you today? Yes    Student Assistance N/A    Location Patient [facility]    Visit Setting with Client Organization    Patient Status Unhoused    Insurance Medicare    Insurance/Financial Assistance Referral N/A    Medication N/A    Medical Provider No    Screening Referrals Made N/A    Medical Referrals Made N/A    Medical Appointment Made N/A    Recently w/o PCP, now 1st time PCP visit completed due to CNs referral or appointment made N/A    Food Have Food Insecurities    Transportation Need transportation assistance;Provided transportation assistance    Housing/Utilities No permanent housing    Interpersonal Safety Do not feel safe at current residence    Interventions Advocate/Support;Counsel;Educate;Navigate Healthcare System;Reviewed Medications    Abnormal to Normal Screening Since Last CN Visit Blood Pressure    Screenings CN Performed Blood Pressure;Pulse Ox;Blood Glucose    Sent Client to Lab for: N/A    Did client attend any of the following based off CNs referral or appointments made? N/A    ED Visit Averted N/A    Life-Saving Intervention Made N/A

## 2022-03-28 ENCOUNTER — Ambulatory Visit: Payer: Medicare Other | Admitting: Family Medicine

## 2022-03-29 ENCOUNTER — Ambulatory Visit: Payer: Medicare Other | Admitting: Family Medicine

## 2022-03-29 ENCOUNTER — Other Ambulatory Visit: Payer: Self-pay | Admitting: Family Medicine

## 2022-03-29 ENCOUNTER — Encounter: Payer: Self-pay | Admitting: Physician Assistant

## 2022-03-29 MED ORDER — LISINOPRIL-HYDROCHLOROTHIAZIDE 20-25 MG PO TABS: 1.0000 | ORAL_TABLET | Freq: Every day | ORAL | 2 refills | Status: DC

## 2022-03-29 MED ORDER — ATORVASTATIN CALCIUM 20 MG PO TABS: 20.0000 mg | ORAL_TABLET | Freq: Every day | ORAL | 2 refills | Status: DC

## 2022-03-29 MED ORDER — METFORMIN HCL 500 MG PO TABS: 500.0000 mg | ORAL_TABLET | Freq: Two times a day (BID) | ORAL | 2 refills | Status: DC

## 2022-03-29 MED ORDER — TAMSULOSIN HCL 0.4 MG PO CAPS: 0.4000 mg | ORAL_CAPSULE | Freq: Every day | ORAL | 0 refills | Status: AC

## 2022-03-29 NOTE — Progress Notes (Signed)
Pt seen by Dr Florene Glen.  Pt ride did not show up, he missed new PCP appt.   He called and rescheduled. He will need labs at that office visit.   The new appt is not until May, will need refills on meds   Refills were sent in to [facility].   Multiple meds refilled. Renal function and K+ were normal 01/2022.   Does not check sugars daily, CBG today 139.   Also has problems with R knee pain, worse with movement. Not as bad as gout pain was in the past.   Was given Voltaren gel.    Has problems w/ nocturia, multiple episodes per night. Relatively new problem. Will try Flomax and let us know how it works.   Today's Vitals   03/29/22 1602  BP: 132/82  Pulse: 82  SpO2: 94%   There is no height or weight on file to calculate BMI.  BMET    Component Value Date/Time   NA 130 (L) 01/19/2022 0646   K 3.9 01/19/2022 0646   CL 95 (L) 01/19/2022 0646   CO2 24 01/19/2022 0646   GLUCOSE 130 (H) 01/19/2022 0646   BUN 9 01/19/2022 0646   CREATININE 0.94 01/19/2022 0646   CALCIUM 9.1 01/19/2022 0646   GFRNONAA >60 01/19/2022 0646    Kyllie Pettijohn, PA-C 03/29/2022 4:04 PM

## 2022-04-04 DIAGNOSIS — Z419 Encounter for procedure for purposes other than remedying health state, unspecified: Secondary | ICD-10-CM

## 2022-04-04 LAB — GLUCOSE, POCT (MANUAL RESULT ENTRY): POC Glucose: 94 mg/dl (ref 70–99)

## 2022-04-04 NOTE — Congregational Nurse Program (Signed)
  Dept: [phone]   Congregational Nurse Program Note  Date of Encounter: 04/04/2022  Clinic visit for complaint of feeling jittery, blood pressure 129/87, pulse 73 and regular, O2 Sat 95%.  Has some minimal swelling of lower extremities.  Reviewed medications but unable to determine if time of episode related to time after taking medications.  Blood glucose 94 AC lunch, is taking metformin and states he has not been eating as many of the afternoon snacks. Past Medical History: Past Medical History:  Diagnosis Date   DM2 (diabetes mellitus, type 2) ([place])    Gout    HLD (hyperlipidemia)    HTN (hypertension)    OA (osteoarthritis)    Seizure ([place])     Encounter Details:  CNP Questionnaire - 04/04/22 1030       Questionnaire   Ask client: Do you give verbal consent for me to treat you today? Yes    Student Assistance N/A    Location Patient [name]    Visit Setting with Client Organization    Patient Status Unhoused    Insurance Medicare    Insurance/Financial Assistance Referral N/A    Medication N/A    Medical Provider No    Screening Referrals Made N/A    Medical Referrals Made N/A    Medical Appointment Made N/A    Recently w/o PCP, now 1st time PCP visit completed due to CNs referral or appointment made N/A    Food Have Food Insecurities    Transportation Need transportation assistance;Provided transportation assistance    Housing/Utilities No permanent housing    Interpersonal Safety Do not feel safe at current residence    Interventions Advocate/Support;Counsel;Educate;Reviewed Medications;Case Management    Abnormal to Normal Screening Since Last CN Visit N/A    Screenings CN Performed Blood Pressure;Pulse Ox;Blood Glucose    Sent Client to Lab for: N/A    Did client attend any of the following based off CNs referral or appointments made? N/A    ED Visit Averted N/A    Life-Saving Intervention Made N/A

## 2022-04-05 NOTE — Congregational Nurse Program (Signed)
  Dept: [phone]   Congregational Nurse Program Note  Date of Encounter: 02/23/2022  Clinic visit for respiratory symptoms, temperature 99.7 orally and blood pressure 135/80.  Recommended he go to the mobile medical clinic, given bus passes and location of the mobile clinic. Past Medical History: Past Medical History:  Diagnosis Date   DM2 (diabetes mellitus, type 2) ([place])    Gout    HLD (hyperlipidemia)    HTN (hypertension)    OA (osteoarthritis)    Seizure ([place])     Encounter Details:

## 2022-04-06 NOTE — Congregational Nurse Program (Signed)
Dept: [phone]   Congregational Nurse Program Note  Date of Encounter: 04/06/2022  Past Medical History: Past Medical History:  Diagnosis Date   DM2 (diabetes mellitus, type 2) ([place])    Gout    HLD (hyperlipidemia)    HTN (hypertension)    OA (osteoarthritis)    Seizure ([place])     Encounter Details:  CNP Questionnaire - 04/06/22 1240       Questionnaire   Ask client: Do you give verbal consent for me to treat you today? Yes    Student Assistance [employer] Nurse    Location Patient [facility]    Visit Setting with Client Organization    Patient Status Unhoused    Insurance Medicare    Insurance/Financial Assistance Referral N/A    Medication N/A    Medical Provider No    Screening Referrals Made N/A    Medical Referrals Made N/A    Medical Appointment Made N/A    Recently w/o PCP, now 1st time PCP visit completed due to CNs referral or appointment made N/A    Food Have Food Insecurities    Transportation Need transportation assistance;Provided transportation assistance    Housing/Utilities No permanent housing    Interpersonal Safety Do not feel safe at current residence    Interventions Advocate/Support;Counsel;Educate;Reviewed Medications;Case Management    Abnormal to Normal Screening Since Last CN Visit N/A    Screenings CN Performed Blood Pressure;Pulse Ox    Sent Client to Lab for: N/A    Did client attend any of the following based off CNs referral or appointments made? N/A    ED Visit Averted N/A    Life-Saving Intervention Made N/A               Dept: [phone]   Congregational Nurse Program Note  Date of Encounter: 04/06/2022  Clinic visit to check blood pressure and review medications.  BP 115/70, pulse 70 and regular.  Has medications to last for more than 30 days and did have the Flomax that was ordered on 03/29/2022 and has been taking it since 3/15. Past Medical History: Past Medical History:  Diagnosis Date   DM2 (diabetes  mellitus, type 2) ([place])    Gout    HLD (hyperlipidemia)    HTN (hypertension)    OA (osteoarthritis)    Seizure ([place])     Encounter Details:  CNP Questionnaire - 04/06/22 1240       Questionnaire   Ask client: Do you give verbal consent for me to treat you today? Yes    Student Assistance [employer] Nurse    Location Patient [facility]    Visit Setting with Client Organization    Patient Status Unhoused    Insurance Medicare    Insurance/Financial Assistance Referral N/A    Medication N/A    Medical Provider No    Screening Referrals Made N/A    Medical Referrals Made N/A    Medical Appointment Made N/A    Recently w/o PCP, now 1st time PCP visit completed due to CNs referral or appointment made N/A    Food Have Food Insecurities    Transportation Need transportation assistance;Provided transportation assistance    Housing/Utilities No permanent housing    Interpersonal Safety Do not feel safe at current residence    Interventions Advocate/Support;Counsel;Educate;Reviewed Medications;Case Management    Abnormal to Normal Screening Since Last CN Visit N/A    Screenings CN Performed Blood Pressure;Pulse Ox    Sent Client to Lab  for: N/A    Did client attend any of the following based off CNs referral or appointments made? N/A    ED Visit Averted N/A    Life-Saving Intervention Made N/A

## 2022-04-11 NOTE — Congregational Nurse Program (Signed)
  Dept: [phone]   Congregational Nurse Program Note  Date of Encounter: 04/11/2022  Clinic visit to discuss medications, afraid he will run out prior to 5/29 appointment.  Resident missed PCP initial appointment when transportation did not arrive on time.  [place], explained the medication situation, placed him on waiting list for a call if an earlier appointment becomes available and confirmed that the practice has his correct telephone number.  Past Medical History: Past Medical History:  Diagnosis Date   DM2 (diabetes mellitus, type 2) ([place])    Gout    HLD (hyperlipidemia)    HTN (hypertension)    OA (osteoarthritis)    Seizure ([place])     Encounter Details:  CNP Questionnaire - 04/11/22 1120       Questionnaire   Ask client: Do you give verbal consent for me to treat you today? Yes    Student Assistance N/A    Location Patient [facility]    Visit Setting with Client Organization    Patient Status Unhoused    Insurance Medicare    Insurance/Financial Assistance Referral N/A    Medication N/A    Medical Provider No    Screening Referrals Made N/A    Medical Referrals Made [facility] PCP/Clinic    Medical Appointment Made N/A    Recently w/o PCP, now 1st time PCP visit completed due to CNs referral or appointment made N/A    Food Have Food Insecurities    Transportation Need transportation assistance;Provided transportation assistance    Housing/Utilities No permanent housing    Interpersonal Safety Do not feel safe at current residence    Interventions Advocate/Support;Counsel;Educate;Reviewed Medications;Case Management;Spiritual Care    Abnormal to Normal Screening Since Last CN Visit N/A    Screenings CN Performed Blood Pressure;Pulse Ox    Sent Client to Lab for: N/A    Did client attend any of the following based off CNs referral or appointments made? N/A    ED Visit Averted N/A    Life-Saving Intervention Made N/A

## 2022-04-12 ENCOUNTER — Encounter: Payer: Self-pay | Admitting: Physician Assistant

## 2022-04-12 ENCOUNTER — Other Ambulatory Visit: Payer: Self-pay | Admitting: Critical Care Medicine

## 2022-04-12 MED ORDER — METFORMIN HCL 500 MG PO TABS: 500.0000 mg | ORAL_TABLET | Freq: Two times a day (BID) | ORAL | 2 refills | Status: DC

## 2022-04-12 NOTE — Progress Notes (Signed)
Pt seen by Dr Joya Gaskins.  Pt missed PCP appts, but has another one scheduled for 05/29.  He is running out of metformin, will need more. He got the other meds, but not the metformin >> it was reordered.   He thinks the boots are causing his R knee to hurt, we have never given him shoes. He put new insoles in his boots not too long ago, but says they have not helped.   His R knee has some crepitus. There is no swelling. He is to mention this to the Primary Care MD.  He was given a pair of size 13 Saucony shoes and a pair of socks. They fit well.   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   Rosaria Ferries, PA-C 04/12/2022 2:22 PM

## 2022-05-02 NOTE — Congregational Nurse Program (Signed)
  Dept: [phone]   Congregational Nurse Program Note  Date of Encounter: 05/02/2022  Clinic visit to check blood pressure and review medications, BP 125/77, pulse 73.  Was concerned that he will not have medication to last until May appointment with new PCP.  Reviewed medication orders and has orders for refills through June and has medicine to last through next week. Past Medical History: Past Medical History:  Diagnosis Date   DM2 (diabetes mellitus, type 2) ([facility])    Gout    HLD (hyperlipidemia)    HTN (hypertension)    OA (osteoarthritis)    Seizure ([facility])     Encounter Details:  CNP Questionnaire - 05/02/22 1140       Questionnaire   Ask client: Do you give verbal consent for me to treat you today? Yes    Student Assistance N/A    [facility]    Visit Setting with Client Organization    Patient Status Unhoused    Insurance Medicare    Insurance/Financial Assistance Referral N/A    Medication N/A    Medical Provider No    Screening Referrals Made N/A    Medical Referrals Made N/A    Medical Appointment Made N/A    Recently w/o PCP, now 1st time PCP visit completed due to CNs referral or appointment made N/A    Food Have Food Insecurities    Transportation Need transportation assistance    Housing/Utilities No permanent housing    Interpersonal Safety Do not feel safe at current residence    Interventions Advocate/Support;Counsel;Educate;Reviewed Medications;Case Management    Abnormal to Normal Screening Since Last CN Visit N/A    Screenings CN Performed Blood Pressure;Pulse Ox    Sent Client to Lab for: N/A    Did client attend any of the following based off CNs referral or appointments made? N/A    ED Visit Averted N/A    Life-Saving Intervention Made N/A

## 2022-05-10 ENCOUNTER — Encounter: Payer: Self-pay | Admitting: Physician Assistant

## 2022-05-10 ENCOUNTER — Other Ambulatory Visit: Payer: Self-pay | Admitting: Physician Assistant

## 2022-05-10 MED ORDER — ATORVASTATIN CALCIUM 20 MG PO TABS: 20.0000 mg | ORAL_TABLET | Freq: Every day | ORAL | 2 refills | Status: AC

## 2022-05-10 MED ORDER — METFORMIN HCL 500 MG PO TABS: 500.0000 mg | ORAL_TABLET | Freq: Two times a day (BID) | ORAL | 2 refills | Status: AC

## 2022-05-10 MED ORDER — LISINOPRIL-HYDROCHLOROTHIAZIDE 20-25 MG PO TABS: 1.0000 | ORAL_TABLET | Freq: Every day | ORAL | 2 refills | Status: AC

## 2022-05-10 NOTE — Progress Notes (Signed)
Pt seen by RBG.  He is anxious because he has 1 BP pill left, needs meds refilled.   His new PCP appt is not till May 29.   He is stressed because he has to stay outside all day.   He is uncomfortable with not being able to go inside.   BP higher than usual today, he has been stressed.   Not sleeping well because some clients are stressing him out.   Although he has some refills, is very anxious about running out of meds.   Sent in refills for Lipitor, lisinopril-HCTZ and metformin.   BP initially  Vitals:   05/10/22 1414  BP: (!) 144/89  Pulse: 71   However, on recheck, after cheerful conversation, BP improved.  137/87, HR  68  No other issues or concerns, feel external stressors are contributing to anxiety. Monitor carefully.  Theodore Demark, PA-C 05/10/2022 2:25 PM

## 2022-05-10 NOTE — Progress Notes (Signed)
Meds sent in [facility]

## 2022-05-17 DEATH — deceased

## 2022-06-07 NOTE — Progress Notes (Signed)
Erroneous encounter-disregard

## 2022-06-14 ENCOUNTER — Encounter: Payer: Medicare Other | Admitting: Family

## 2022-06-14 DIAGNOSIS — Z7689 Persons encountering health services in other specified circumstances: Secondary | ICD-10-CM
